# Patient Record
Sex: Female | Born: 1970 | Race: White | Hispanic: No | Marital: Married | Smoking: Never smoker
Health system: Southern US, Community
[De-identification: ages and names within clinical notes are randomized; demographics above are authoritative.]

---

## 1998-07-01 ENCOUNTER — Other Ambulatory Visit: Admission: RE | Admit: 1998-07-01 | Discharge: 1998-07-01 | Payer: Self-pay | Admitting: Obstetrics and Gynecology

## 2000-02-15 ENCOUNTER — Other Ambulatory Visit: Admission: RE | Admit: 2000-02-15 | Discharge: 2000-02-15 | Payer: Self-pay | Admitting: Obstetrics and Gynecology

## 2001-02-27 ENCOUNTER — Other Ambulatory Visit: Admission: RE | Admit: 2001-02-27 | Discharge: 2001-02-27 | Payer: Self-pay | Admitting: Obstetrics and Gynecology

## 2002-05-07 ENCOUNTER — Other Ambulatory Visit: Admission: RE | Admit: 2002-05-07 | Discharge: 2002-05-07 | Payer: Self-pay | Admitting: Obstetrics and Gynecology

## 2003-06-17 ENCOUNTER — Other Ambulatory Visit: Admission: RE | Admit: 2003-06-17 | Discharge: 2003-06-17 | Payer: Self-pay | Admitting: Obstetrics and Gynecology

## 2005-01-02 ENCOUNTER — Other Ambulatory Visit: Admission: RE | Admit: 2005-01-02 | Discharge: 2005-01-02 | Payer: Self-pay | Admitting: Obstetrics and Gynecology

## 2005-01-12 ENCOUNTER — Encounter: Admission: RE | Admit: 2005-01-12 | Discharge: 2005-01-12 | Payer: Self-pay | Admitting: Obstetrics and Gynecology

## 2005-08-22 ENCOUNTER — Other Ambulatory Visit: Admission: RE | Admit: 2005-08-22 | Discharge: 2005-08-22 | Payer: Self-pay | Admitting: Obstetrics and Gynecology

## 2006-03-09 ENCOUNTER — Inpatient Hospital Stay (HOSPITAL_COMMUNITY): Admission: RE | Admit: 2006-03-09 | Discharge: 2006-03-12 | Payer: Self-pay | Admitting: Obstetrics and Gynecology

## 2014-01-28 ENCOUNTER — Other Ambulatory Visit: Payer: Self-pay | Admitting: Obstetrics and Gynecology

## 2014-01-28 DIAGNOSIS — R928 Other abnormal and inconclusive findings on diagnostic imaging of breast: Secondary | ICD-10-CM

## 2014-02-04 ENCOUNTER — Ambulatory Visit
Admission: RE | Admit: 2014-02-04 | Discharge: 2014-02-04 | Disposition: A | Discharge status: Home / Self Care | Payer: BC Managed Care – PPO | Source: Ambulatory Visit | Attending: Obstetrics and Gynecology | Admitting: Obstetrics and Gynecology

## 2014-02-04 DIAGNOSIS — R928 Other abnormal and inconclusive findings on diagnostic imaging of breast: Secondary | ICD-10-CM

## 2014-02-13 ENCOUNTER — Other Ambulatory Visit: Payer: Self-pay

## 2015-02-23 ENCOUNTER — Other Ambulatory Visit: Payer: Self-pay

## 2015-02-23 DIAGNOSIS — Z1231 Encounter for screening mammogram for malignant neoplasm of breast: Secondary | ICD-10-CM

## 2015-03-12 ENCOUNTER — Ambulatory Visit
Admission: RE | Admit: 2015-03-12 | Discharge: 2015-03-12 | Disposition: A | Discharge status: Home / Self Care | Payer: BLUE CROSS/BLUE SHIELD | Source: Ambulatory Visit

## 2015-03-12 DIAGNOSIS — Z1231 Encounter for screening mammogram for malignant neoplasm of breast: Secondary | ICD-10-CM

## 2015-05-07 ENCOUNTER — Other Ambulatory Visit: Payer: Self-pay | Admitting: Obstetrics and Gynecology

## 2015-05-10 LAB — CYTOLOGY - PAP

## 2016-08-17 ENCOUNTER — Other Ambulatory Visit: Payer: Self-pay | Admitting: Obstetrics and Gynecology

## 2016-08-17 DIAGNOSIS — Z1231 Encounter for screening mammogram for malignant neoplasm of breast: Secondary | ICD-10-CM

## 2016-08-22 ENCOUNTER — Ambulatory Visit
Admission: RE | Admit: 2016-08-22 | Discharge: 2016-08-22 | Disposition: A | Discharge status: Home / Self Care | Payer: BLUE CROSS/BLUE SHIELD | Source: Ambulatory Visit | Attending: Obstetrics and Gynecology | Admitting: Obstetrics and Gynecology

## 2016-08-22 ENCOUNTER — Encounter: Payer: Self-pay | Admitting: Podiatry

## 2016-08-22 ENCOUNTER — Ambulatory Visit (INDEPENDENT_AMBULATORY_CARE_PROVIDER_SITE_OTHER): Payer: BLUE CROSS/BLUE SHIELD

## 2016-08-22 ENCOUNTER — Ambulatory Visit (INDEPENDENT_AMBULATORY_CARE_PROVIDER_SITE_OTHER): Payer: BLUE CROSS/BLUE SHIELD | Admitting: Podiatry

## 2016-08-22 DIAGNOSIS — R52 Pain, unspecified: Secondary | ICD-10-CM

## 2016-08-22 DIAGNOSIS — M722 Plantar fascial fibromatosis: Secondary | ICD-10-CM

## 2016-08-22 DIAGNOSIS — Z1231 Encounter for screening mammogram for malignant neoplasm of breast: Secondary | ICD-10-CM

## 2016-08-22 MED ORDER — MELOXICAM 15 MG PO TABS: 15.0000 mg | ORAL_TABLET | Freq: Every day | ORAL | 2 refills | Status: DC

## 2016-08-22 NOTE — Patient Instructions (Signed)

## 2016-08-22 NOTE — Progress Notes (Signed)
   Subjective:    Patient ID: Chelsea Weber, female    DOB: 1971-10-31, 45 y.o.   MRN: [id number]  HPI  45 year old female presents the also concerns her right heel pain which is been ongoing since June when she was walking on the beach. She is having a nagging pain in the bottom of her right heel which hurts worse than when she first gets up after being on her feet all day. She denies any recent injury or trauma. Denies any swelling or redness. No numbness or tingling. The pain does not wake up at night. No other complaints.   Review of Systems  All other systems reviewed and are negative.      Objective:   Physical Exam General: AAO x3, NAD  Dermatological: Skin is warm, dry and supple bilateral. Nails x 10 are well manicured; remaining integument appears unremarkable at this time. There are no open sores, no preulcerative lesions, no rash or signs of infection present.  Vascular: Dorsalis Pedis artery and Posterior Tibial artery pedal pulses are 2/4 bilateral with immedate capillary fill time. There is no pain with calf compression, swelling, warmth, erythema.   Neruologic: Grossly intact via light touch bilateral. Vibratory intact via tuning fork bilateral. Protective threshold with Semmes Wienstein monofilament intact to all pedal sites bilateral.   Musculoskeletal: Tenderness to palpation along the plantar medial tubercle of the calcaneus at the insertion of plantar fascia on the right foot. There is no pain along the course of the plantar fascia within the arch of the foot. Plantar fascia appears to be intact. There is no pain with lateral compression of the calcaneus or pain with vibratory sensation. There is no pain along the course or insertion of the achilles tendon. No other areas of tenderness to bilateral lower extremities. Muscular strength 5/5 in all groups tested bilateral. Equinus is present.   Gait: Unassisted, Nonantalgic.      Assessment & Plan:  45 year old  female right heel pain, likely plantar fasciitis -Treatment options discussed including all alternatives, risks, and complications -Etiology of symptoms were discussed -X-rays were obtained and reviewed with the patient. No evidence of acute fracture. -Patient elects to proceed with steroid injection into the right heel. Under sterile skin preparation, a total of 2.5cc of kenalog 10, 0.5% Marcaine plain, and 2% lidocaine plain were infiltrated into the symptomatic area without complication. A band-aid was applied. Patient tolerated the injection well without complication. Post-injection care with discussed with the patient. Discussed with the patient to ice the area over the next couple of days to help prevent a steroid flare.  -Prescribed mobic. Discussed side effects of the medication and directed to stop if any are to occur and call the office.  -Plantar fascial brace dispensed -Discussed shoe gear modifications and orthotics -Stretching icing daily -Follow-up in 3 weeks or sooner if any problems arise. In the meantime, encouraged to call the office with any questions, concerns, change in symptoms.   Ovid CurdMatthew Wagoner, DPM

## 2016-09-11 ENCOUNTER — Encounter: Payer: Self-pay | Admitting: Podiatry

## 2016-09-11 ENCOUNTER — Ambulatory Visit (INDEPENDENT_AMBULATORY_CARE_PROVIDER_SITE_OTHER): Payer: BLUE CROSS/BLUE SHIELD | Admitting: Podiatry

## 2016-09-11 DIAGNOSIS — M722 Plantar fascial fibromatosis: Secondary | ICD-10-CM

## 2016-09-11 NOTE — Progress Notes (Signed)
Subjective: Chelsea DoveDonna K Belgard presents to the office today for follow-up evaluation of right heel pain. She states that she is doing better although she still consent pain involving the right heel. Since last appointment she went hiking 10 miles and did well but towards the and she was given discomfort. She states that she gets some tightness to the palm of her feet in the morning when she gets up and she feels when she pulls her ankles back she feels the stretching it feels better. Denies any swelling or redness. No numbness or tingling. No other complaints at this time. No acute changes since last appointment. They deny any systemic complaints such as fevers, chills, nausea, vomiting.  Objective: General: AAO x3, NAD  Dermatological: Skin is warm, dry and supple bilateral. Nails x 10 are well manicured; remaining integument appears unremarkable at this time. There are no open sores, no preulcerative lesions, no rash or signs of infection present.  Vascular: Dorsalis Pedis artery and Posterior Tibial artery pedal pulses are 2/4 bilateral with immedate capillary fill time. Pedal hair growth present. There is no pain with calf compression, swelling, warmth, erythema.   Neruologic: Grossly intact via light touch bilateral. Vibratory intact via tuning fork bilateral. Protective threshold with Semmes Wienstein monofilament intact to all pedal sites bilateral.   Musculoskeletal: There is improved but continued tenderness palpation along the plantar medial tubercle of the calcaneus at the insertion of the plantar fascia on the rightfoot. There is no pain along the course of the plantar fascia within the arch of the foot. Plantar fascia appears to be intact bilaterally. There is no pain with lateral compression of the calcaneus and there is no pain with vibratory sensation. There is no pain along the course or insertion of the Achilles tendon. There are no other areas of tenderness to bilateral lower  extremities. No gross boney pedal deformities bilateral. No pain, crepitus, or limitation noted with foot and ankle range of motion bilateral. Muscular strength 5/5 in all groups tested bilateral.  Gait: Unassisted, Nonantalgic.   Assessment: Presents for follow-up evaluation for heel pain, likely plantar fasciitis   Plan: -Treatment options discussed including all alternatives, risks, and complications -Patient elects to proceed with steroid injection into the right heel. Under sterile skin preparation, a total of 2.5cc of kenalog 10, 0.5% Marcaine plain, and 2% lidocaine plain were infiltrated into the symptomatic area without complication. A band-aid was applied. Patient tolerated the injection well without complication. Post-injection care with discussed with the patient. Discussed with the patient to ice the area over the next couple of days to help prevent a steroid flare.  -Night splint dispensed -Scanned for orthotics today and sent to [facility].  -Ice and stretching exercises on a daily basis. -Continue supportive shoe gear. -Follow-up in 4 weeks or sooner if any problems arise. In the meantime, encouraged to call the office with any questions, concerns, change in symptoms.   Ovid CurdMatthew Elexia Friedt, DPM

## 2016-09-22 ENCOUNTER — Telehealth: Payer: Self-pay | Admitting: *Deleted

## 2016-09-22 MED ORDER — MELOXICAM 15 MG PO TABS
15.0000 mg | ORAL_TABLET | Freq: Every day | ORAL | 0 refills | Status: DC
Start: 2016-09-22 — End: 2016-12-29

## 2016-09-22 NOTE — Telephone Encounter (Signed)
Received request for #90 Meloxicam 15mg . Dr. Bary CastillaWagoner okayed with total prescribed as #90 +0refills.

## 2016-10-13 ENCOUNTER — Ambulatory Visit (INDEPENDENT_AMBULATORY_CARE_PROVIDER_SITE_OTHER): Payer: BLUE CROSS/BLUE SHIELD | Admitting: Podiatry

## 2016-10-13 ENCOUNTER — Encounter: Payer: Self-pay | Admitting: Podiatry

## 2016-10-13 DIAGNOSIS — M722 Plantar fascial fibromatosis: Secondary | ICD-10-CM

## 2016-10-13 NOTE — Patient Instructions (Signed)

## 2016-10-16 NOTE — Progress Notes (Signed)
Patient presents to [facility]. She was seen by Hadley PenLisa Cox, CMA to dispense orthotics. Break in instructions discussed. She declined appointment by myself. No new complaints. Follow-up in 4 weeks.

## 2016-12-29 ENCOUNTER — Other Ambulatory Visit: Payer: Self-pay | Admitting: Podiatry

## 2016-12-29 NOTE — Telephone Encounter (Signed)
Pt needs an appt prior to future refills. 

## 2017-08-01 ENCOUNTER — Other Ambulatory Visit: Payer: Self-pay | Admitting: Obstetrics and Gynecology

## 2017-08-01 DIAGNOSIS — Z1231 Encounter for screening mammogram for malignant neoplasm of breast: Secondary | ICD-10-CM

## 2017-09-04 ENCOUNTER — Ambulatory Visit
Admission: RE | Admit: 2017-09-04 | Discharge: 2017-09-04 | Disposition: A | Discharge status: Home / Self Care | Payer: BLUE CROSS/BLUE SHIELD | Source: Ambulatory Visit | Attending: Obstetrics and Gynecology | Admitting: Obstetrics and Gynecology

## 2017-09-04 DIAGNOSIS — Z1231 Encounter for screening mammogram for malignant neoplasm of breast: Secondary | ICD-10-CM

## 2018-08-29 ENCOUNTER — Other Ambulatory Visit: Payer: Self-pay | Admitting: Obstetrics and Gynecology

## 2018-08-29 DIAGNOSIS — Z1231 Encounter for screening mammogram for malignant neoplasm of breast: Secondary | ICD-10-CM

## 2018-10-04 ENCOUNTER — Ambulatory Visit
Admission: RE | Admit: 2018-10-04 | Discharge: 2018-10-04 | Disposition: A | Discharge status: Home / Self Care | Payer: BLUE CROSS/BLUE SHIELD | Source: Ambulatory Visit | Attending: Obstetrics and Gynecology | Admitting: Obstetrics and Gynecology

## 2018-10-04 DIAGNOSIS — Z1231 Encounter for screening mammogram for malignant neoplasm of breast: Secondary | ICD-10-CM

## 2018-10-07 ENCOUNTER — Other Ambulatory Visit: Payer: Self-pay | Admitting: Obstetrics and Gynecology

## 2018-10-07 DIAGNOSIS — R928 Other abnormal and inconclusive findings on diagnostic imaging of breast: Secondary | ICD-10-CM

## 2018-10-09 ENCOUNTER — Ambulatory Visit
Admission: RE | Admit: 2018-10-09 | Discharge: 2018-10-09 | Disposition: A | Discharge status: Home / Self Care | Payer: BLUE CROSS/BLUE SHIELD | Source: Ambulatory Visit | Attending: Obstetrics and Gynecology | Admitting: Obstetrics and Gynecology

## 2018-10-09 DIAGNOSIS — R928 Other abnormal and inconclusive findings on diagnostic imaging of breast: Secondary | ICD-10-CM

## 2018-10-11 ENCOUNTER — Other Ambulatory Visit: Payer: BLUE CROSS/BLUE SHIELD

## 2019-06-15 IMAGING — MG DIGITAL DIAGNOSTIC UNILATERAL LEFT MAMMOGRAM WITH TOMO AND CAD
4 series · 4 of 12 positions shown · non-contrast
Comparison: Previous exam(s).

CLINICAL DATA: 47-year-old female recalled from 2 D screening
mammogram dated 10/04/2018 for a possible left breast mass.

EXAM:
DIGITAL DIAGNOSTIC LEFT MAMMOGRAM WITH CAD AND TOMO
ULTRASOUND LEFT BREAST

[L CC synth-2D]
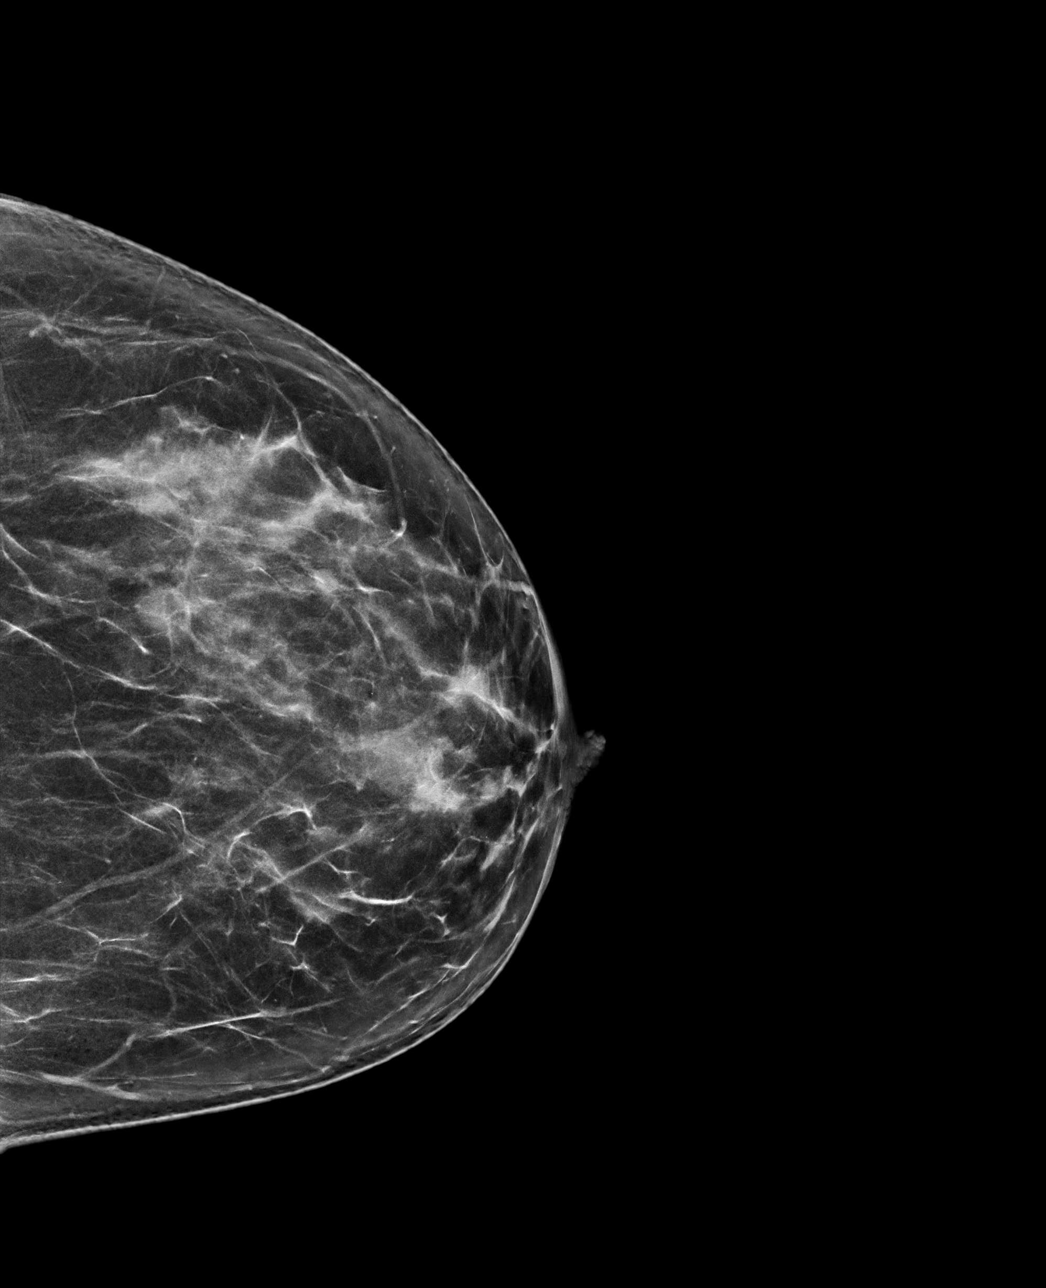

[L MLO synth-2D]
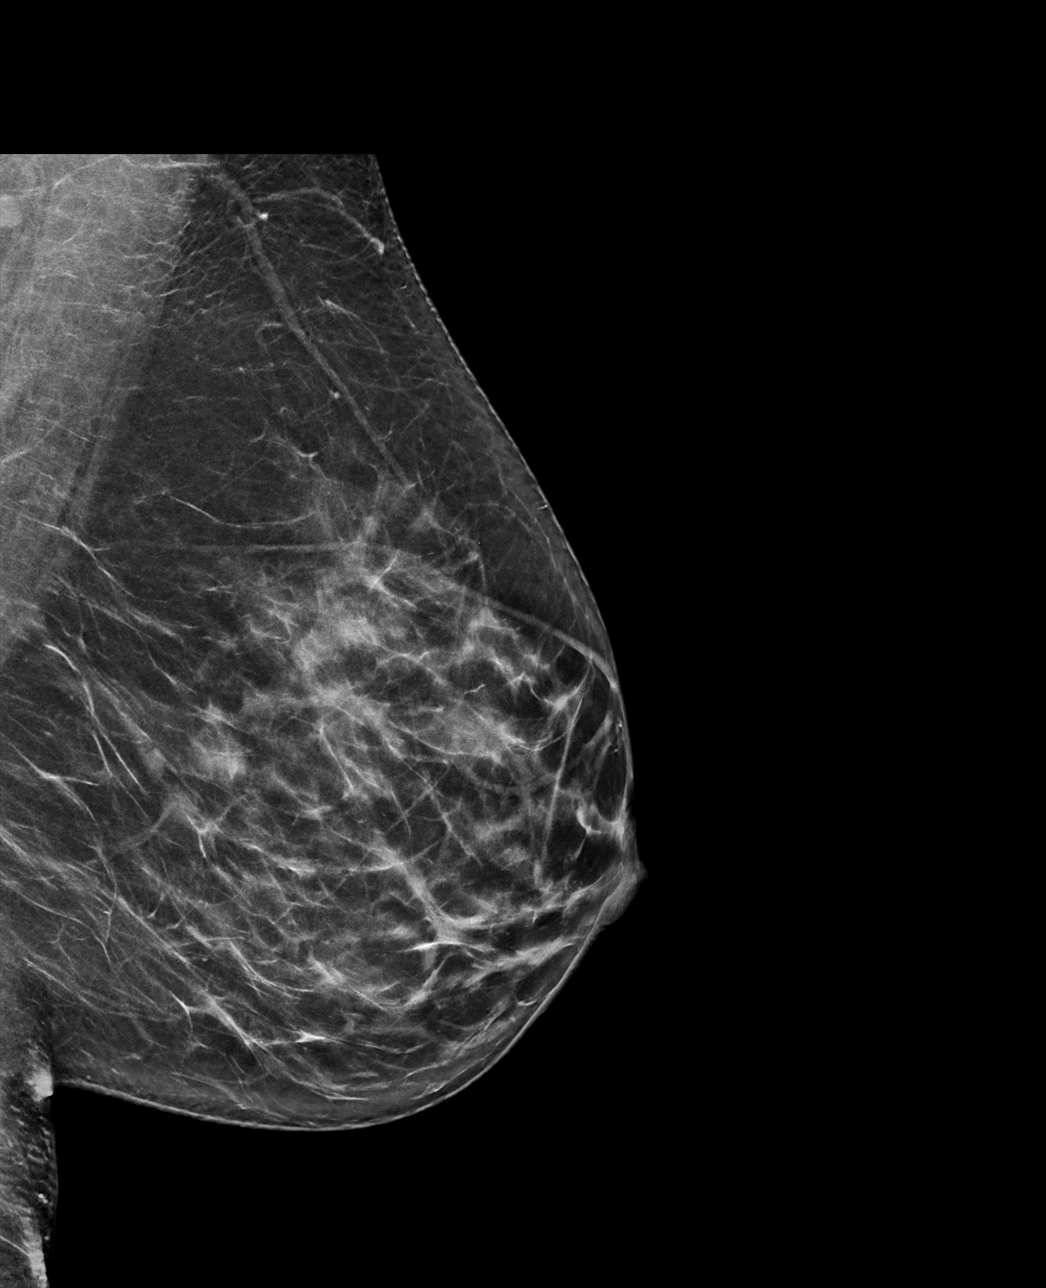

[L MLO tomo · tomo slice 39/76.0]
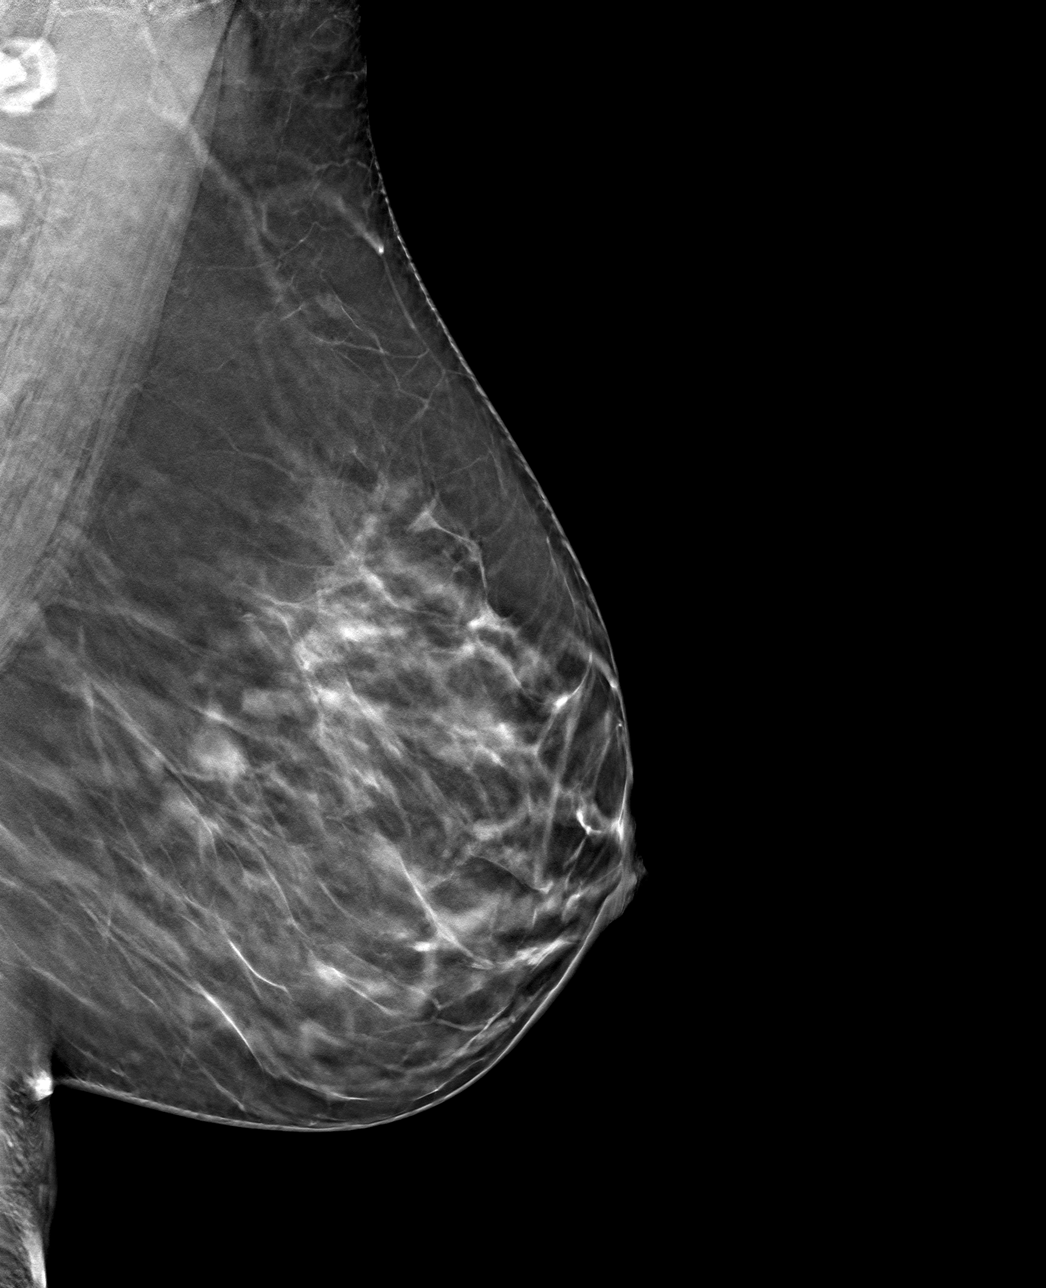

[L CC tomo · tomo slice 38/75.0]
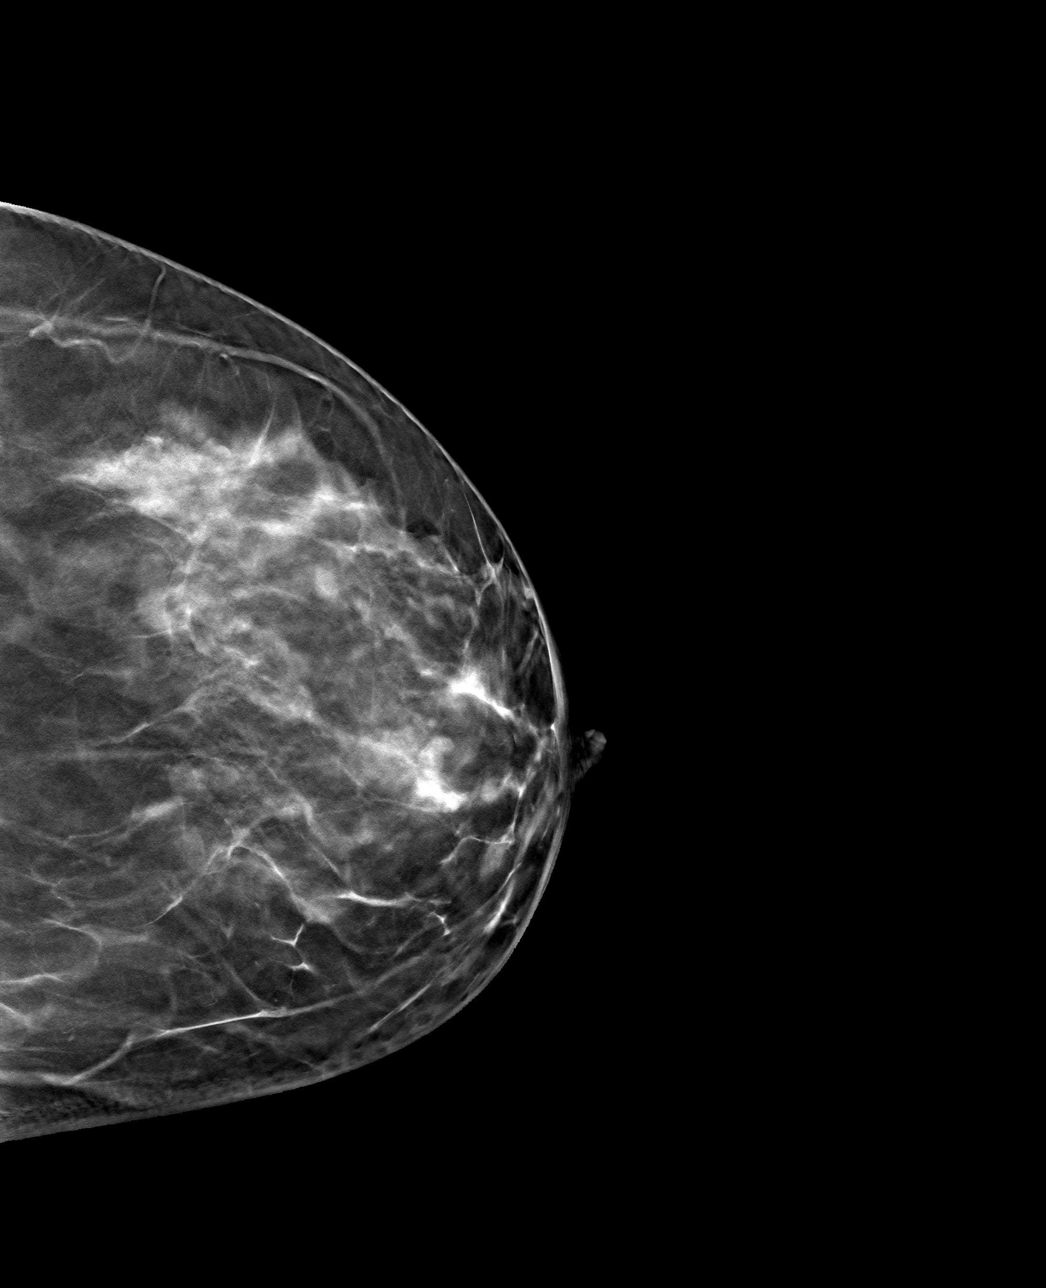

[4 of 12 positions shown; findings below may reference images not displayed]

ACR Breast Density Category c: The breast tissue is heterogeneously
dense, which may obscure small masses.
FINDINGS: There is a persistent oval, circumscribed equal density mass in the
central lateral left breast at far posterior depth. Per further
evaluation with ultrasound was performed.

Mammographic images were processed with CAD.

Targeted ultrasound is performed, showing an oval, circumscribed
nearly anechoic mass at the 9 o'clock position 6 cm from the nipple.
Note is made of posterior acoustic enhancement. It measures 1.2 x
1.2 x 0.6 cm. There is no internal vascularity. This correlates well
with the mammographic finding and is consistent with a simple cyst.
IMPRESSION: Benign left breast simple cyst corresponding with the screening
mammographic findings. No further imaging follow-up required.

RECOMMENDATION:
Screening mammogram in one year.(Code:9D-Q-1X4)

I have discussed the findings and recommendations with the patient.
Results were also provided in writing at the conclusion of the
visit. If applicable, a reminder letter will be sent to the patient
regarding the next appointment.

BI-RADS CATEGORY  2: Benign.

## 2019-06-15 IMAGING — US ULTRASOUND LEFT BREAST LIMITED
1 series · 5 of 5 positions shown · non-contrast
Comparison: Previous exam(s).

CLINICAL DATA: 47-year-old female recalled from 2 D screening
mammogram dated 10/04/2018 for a possible left breast mass.

EXAM:
DIGITAL DIAGNOSTIC LEFT MAMMOGRAM WITH CAD AND TOMO
ULTRASOUND LEFT BREAST

[Series 1: ultrasound left breast limited · 0.08mm/px · 5 of 5 slices shown]
[im 1/5]
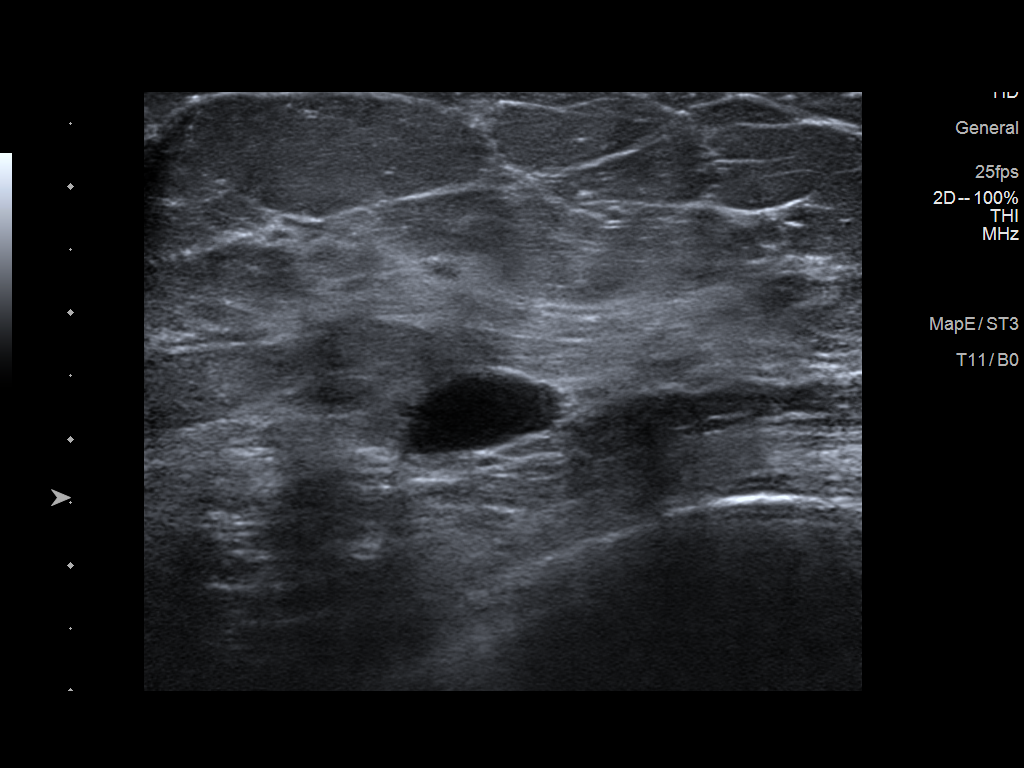
[im 2/5]
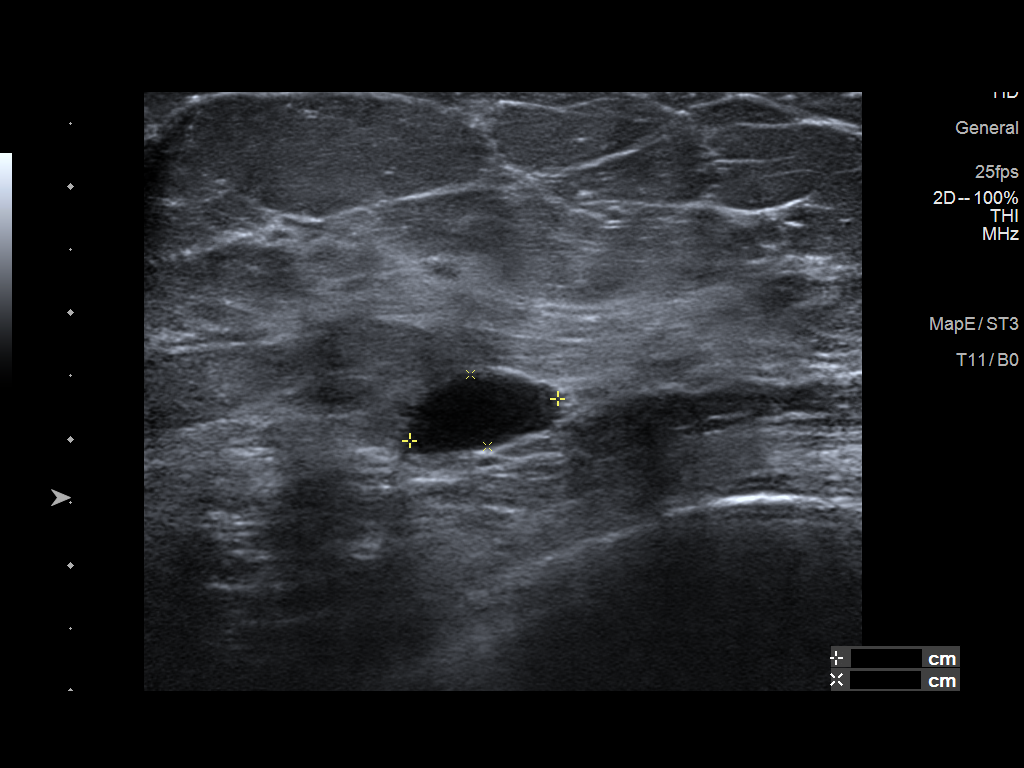
[im 3/5]
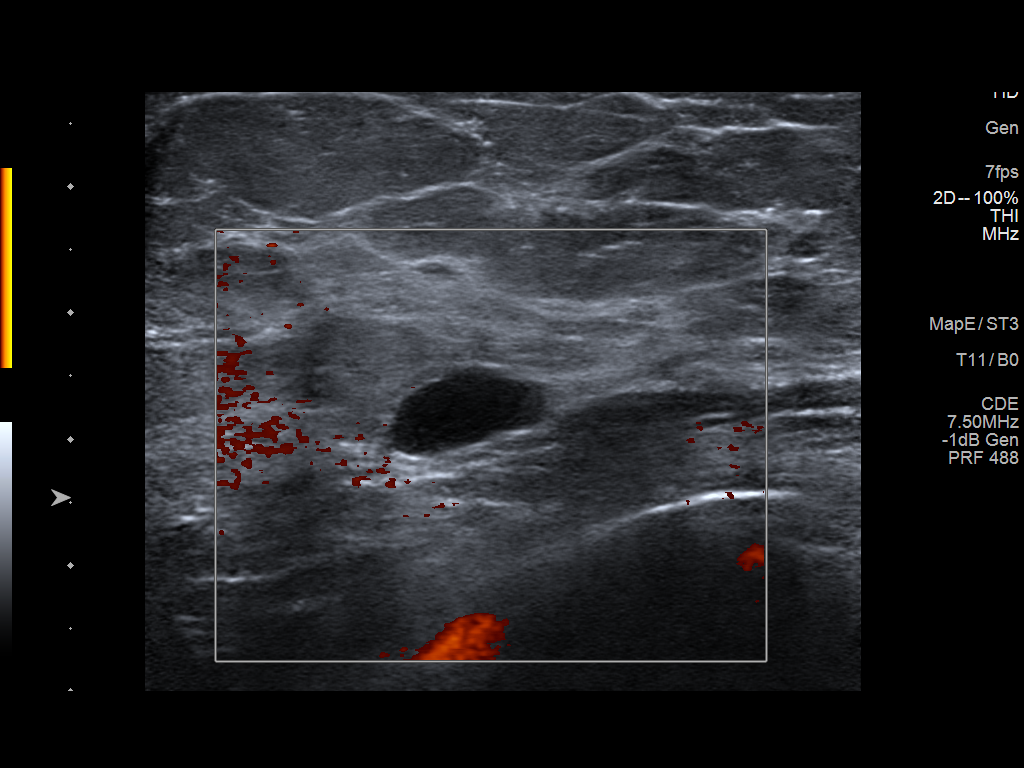
[im 4/5]
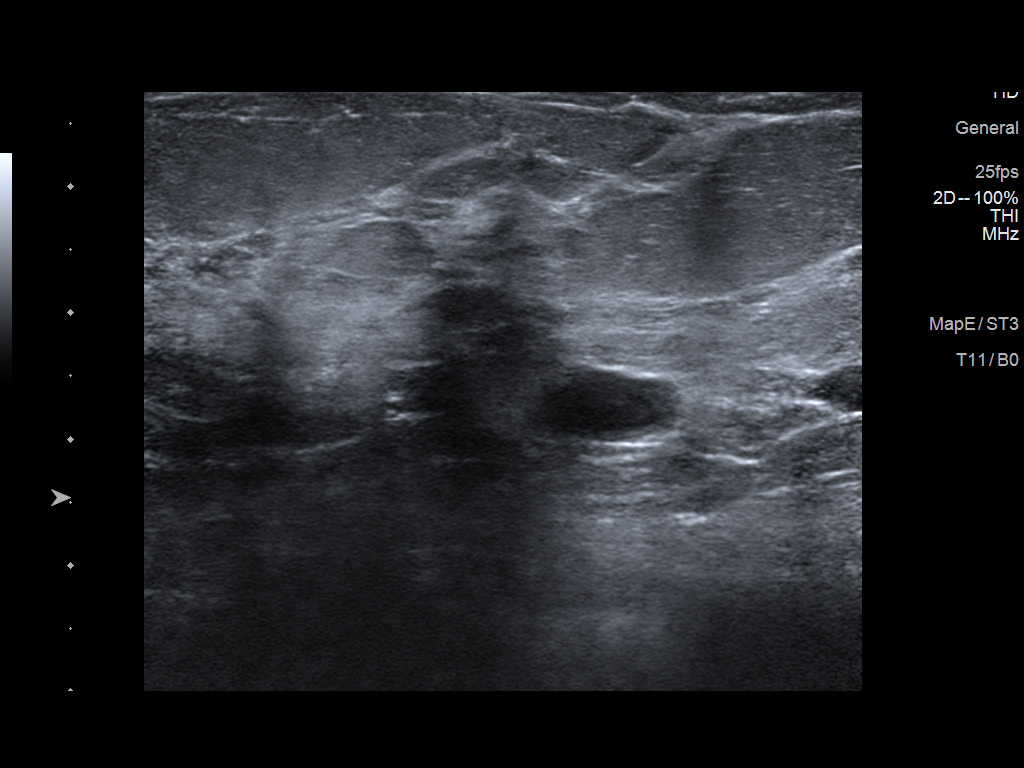
[im 5/5]
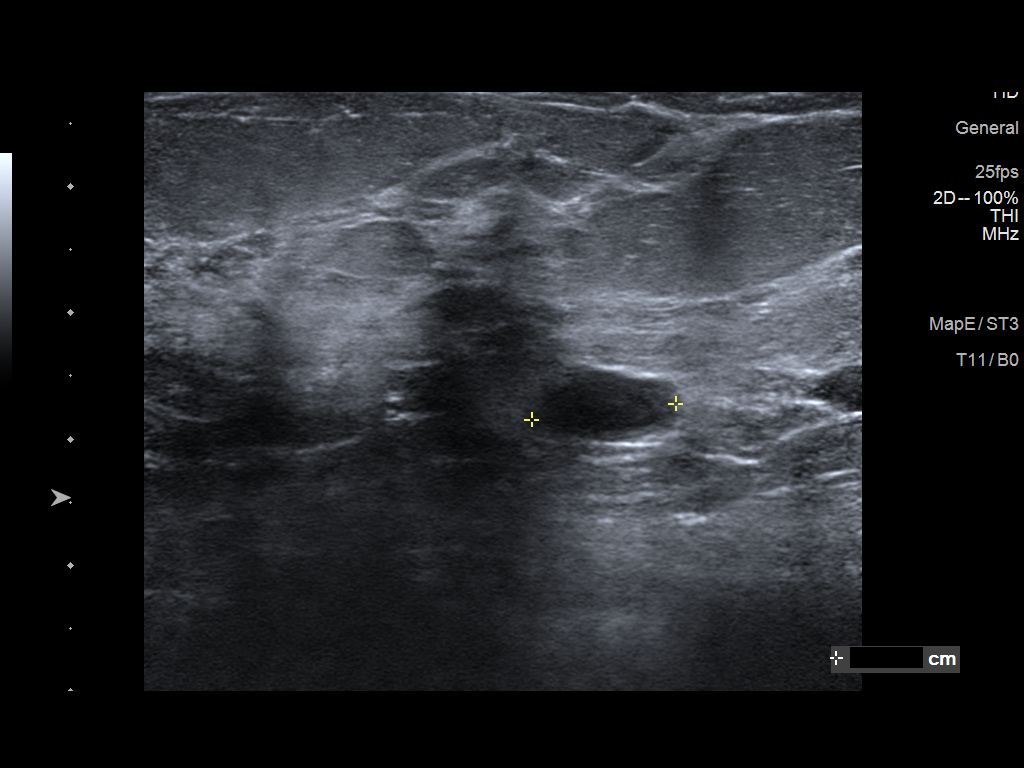

[5 of 5 positions shown; findings below may reference images not displayed]

ACR Breast Density Category c: The breast tissue is heterogeneously
dense, which may obscure small masses.
FINDINGS: There is a persistent oval, circumscribed equal density mass in the
central lateral left breast at far posterior depth. Per further
evaluation with ultrasound was performed.

Mammographic images were processed with CAD.

Targeted ultrasound is performed, showing an oval, circumscribed
nearly anechoic mass at the 9 o'clock position 6 cm from the nipple.
Note is made of posterior acoustic enhancement. It measures 1.2 x
1.2 x 0.6 cm. There is no internal vascularity. This correlates well
with the mammographic finding and is consistent with a simple cyst.
IMPRESSION: Benign left breast simple cyst corresponding with the screening
mammographic findings. No further imaging follow-up required.

RECOMMENDATION:
Screening mammogram in one year.(Code:9D-Q-1X4)

I have discussed the findings and recommendations with the patient.
Results were also provided in writing at the conclusion of the
visit. If applicable, a reminder letter will be sent to the patient
regarding the next appointment.

BI-RADS CATEGORY  2: Benign.

## 2019-07-04 ENCOUNTER — Other Ambulatory Visit: Payer: Self-pay | Admitting: *Deleted

## 2019-07-04 DIAGNOSIS — Z20822 Contact with and (suspected) exposure to covid-19: Secondary | ICD-10-CM

## 2019-07-06 LAB — NOVEL CORONAVIRUS, NAA: SARS-CoV-2, NAA: NOT DETECTED
